# Patient Record
Sex: Female | Born: 2000 | Race: Black or African American | Hispanic: No | Marital: Single | Smoking: Never smoker
Health system: Southern US, Community
[De-identification: ages and names within clinical notes are randomized; demographics above are authoritative.]

## PROBLEM LIST (undated history)

## (undated) DIAGNOSIS — J45909 Unspecified asthma, uncomplicated: Secondary | ICD-10-CM

## (undated) DIAGNOSIS — R519 Headache, unspecified: Secondary | ICD-10-CM

## (undated) DIAGNOSIS — R51 Headache: Secondary | ICD-10-CM

## (undated) DIAGNOSIS — A549 Gonococcal infection, unspecified: Secondary | ICD-10-CM

## (undated) DIAGNOSIS — F419 Anxiety disorder, unspecified: Secondary | ICD-10-CM

## (undated) DIAGNOSIS — D649 Anemia, unspecified: Secondary | ICD-10-CM

## (undated) DIAGNOSIS — D573 Sickle-cell trait: Secondary | ICD-10-CM

## (undated) HISTORY — DX: Unspecified asthma, uncomplicated: J45.909

## (undated) HISTORY — DX: Sickle-cell trait: D57.3

## (undated) HISTORY — PX: WRIST SURGERY: SHX841

## (undated) HISTORY — DX: Headache, unspecified: R51.9

## (undated) HISTORY — DX: Anemia, unspecified: D64.9

## (undated) HISTORY — DX: Headache: R51

## (undated) HISTORY — DX: Gonococcal infection, unspecified: A54.9

## (undated) HISTORY — DX: Anxiety disorder, unspecified: F41.9

---
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
## 2018-01-17 ENCOUNTER — Ambulatory Visit: Payer: Medicaid Other | Admitting: Obstetrics and Gynecology

## 2018-01-29 ENCOUNTER — Ambulatory Visit: Payer: Medicaid Other | Admitting: Obstetrics and Gynecology

## 2019-01-10 IMAGING — CR DG ELBOW COMPLETE 3+V*R*
1 series · 4 of 4 positions shown · non-contrast
Comparison: None.

CLINICAL DATA: 16-year-old who injured the right elbow after
jumping off of a bed earlier today. Initial encounter.

EXAM:
RIGHT ELBOW - COMPLETE 3+ VIEW

[Series 1: x elbow ap right · 0.14mm/px · 4 of 4 slices shown]
[im 1/4]
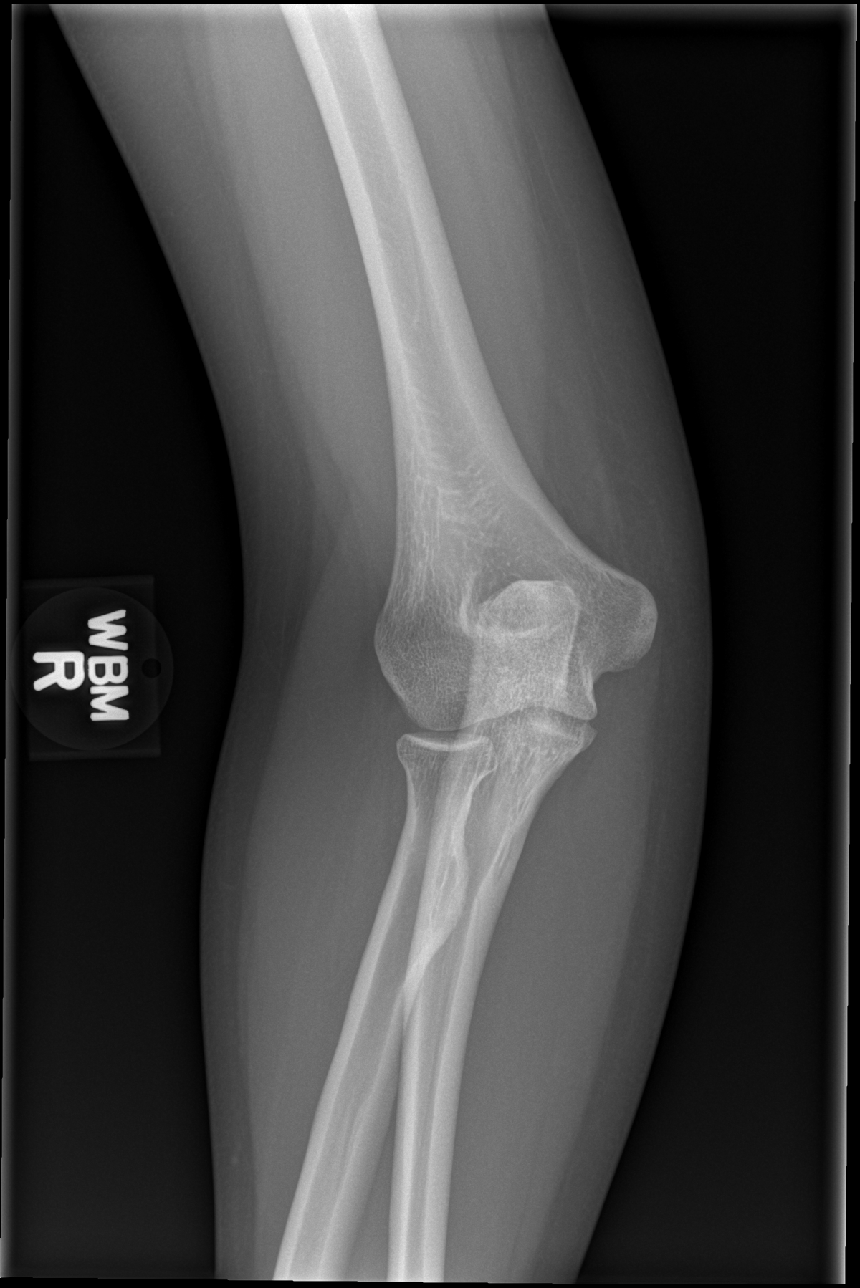
[im 2/4]
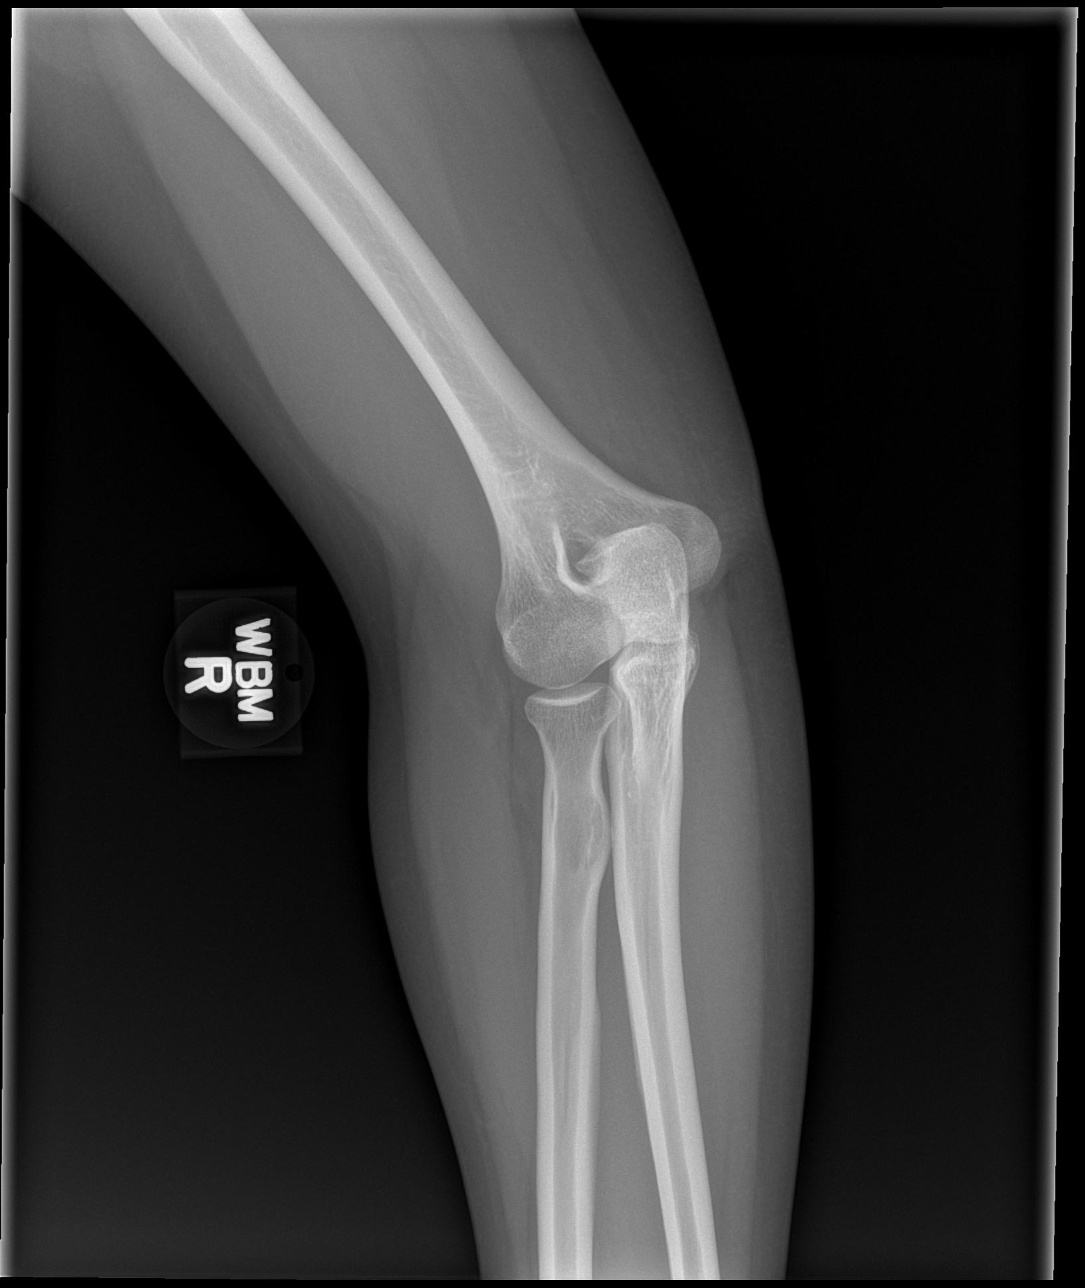
[im 3/4]
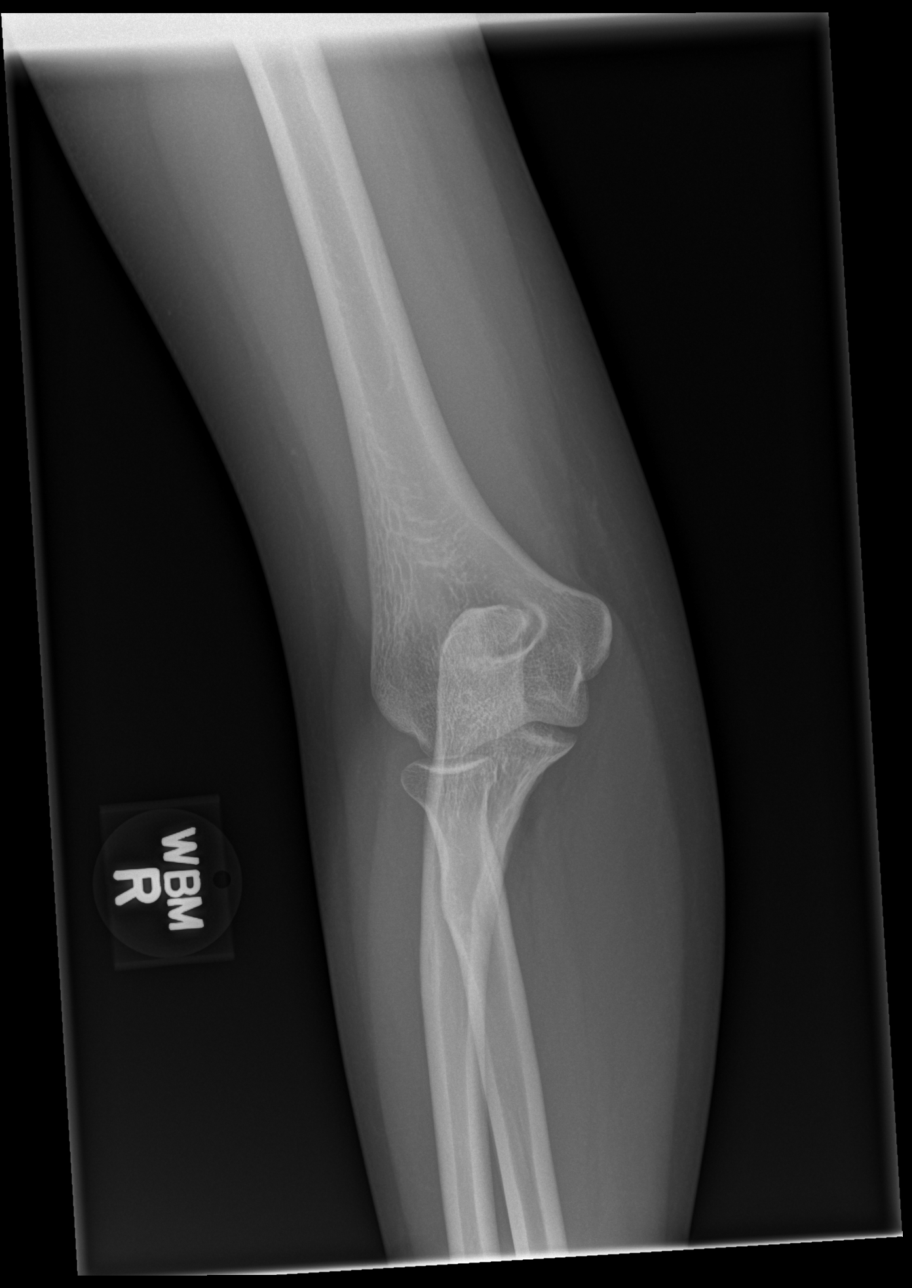
[im 4/4]
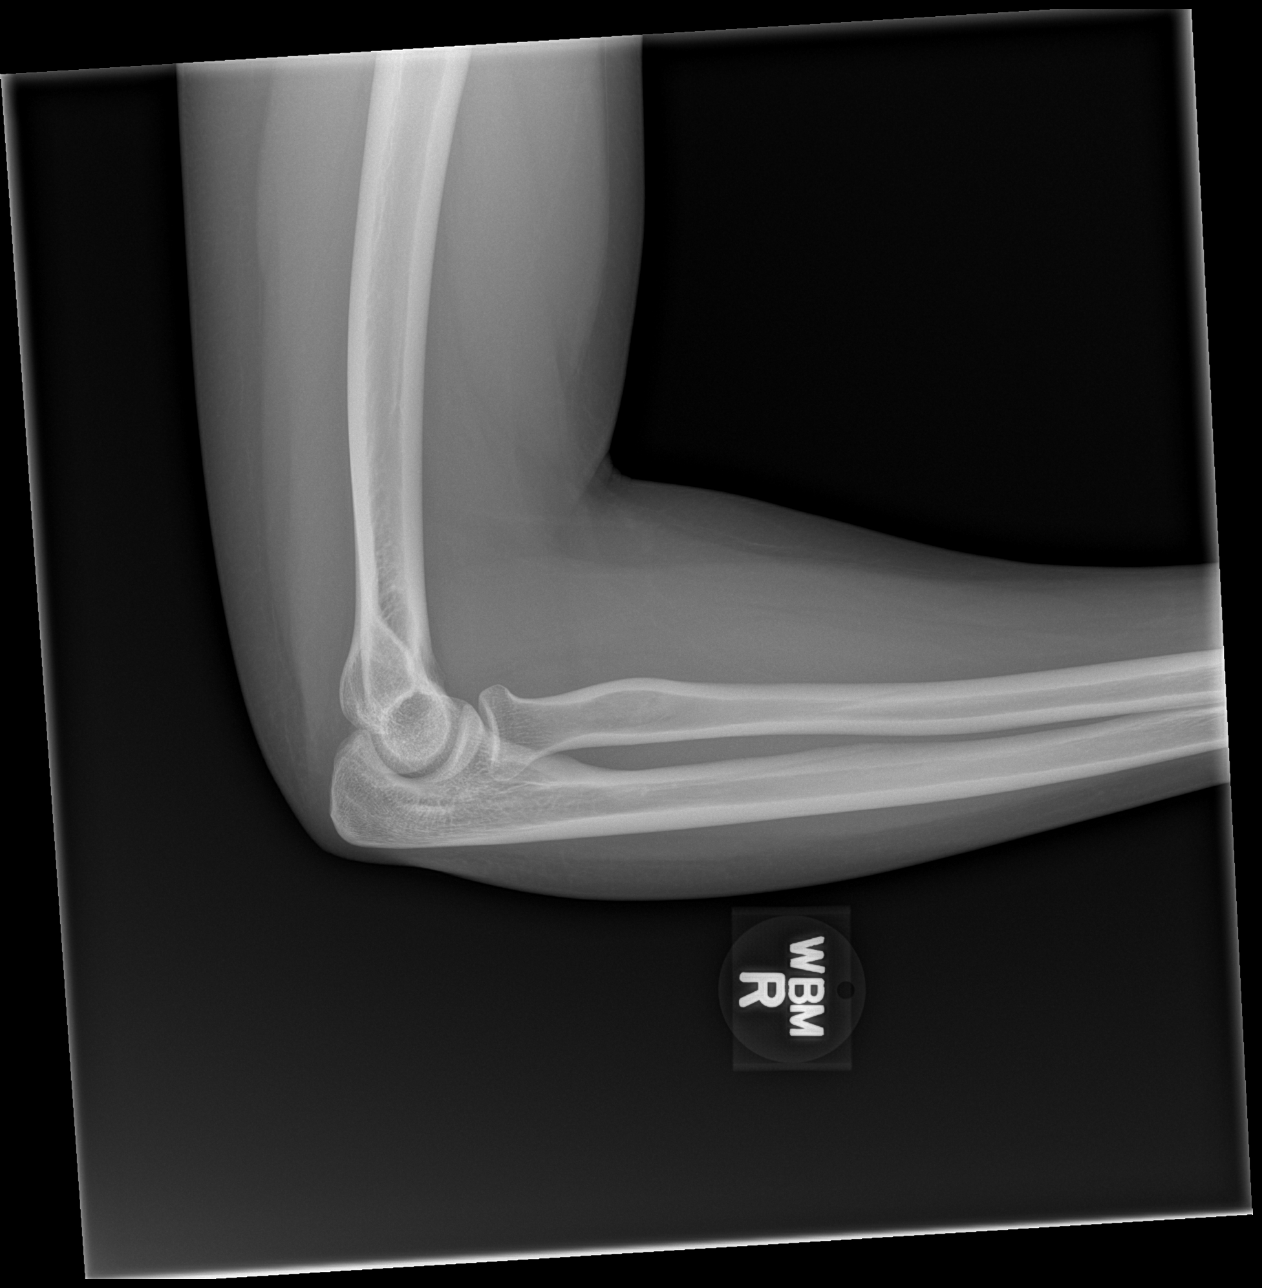

[4 of 4 positions shown; findings below may reference images not displayed]

FINDINGS: No evidence of acute fracture or dislocation. Well-preserved joint
spaces. No intrinsic osseous abnormalities. No posterior fat pad to
confirm joint effusion or hemarthrosis.
IMPRESSION: Normal examination.

## 2020-08-16 ENCOUNTER — Other Ambulatory Visit: Payer: Self-pay

## 2020-08-16 ENCOUNTER — Ambulatory Visit (INDEPENDENT_AMBULATORY_CARE_PROVIDER_SITE_OTHER): Payer: Medicaid Other | Admitting: Obstetrics and Gynecology

## 2020-08-16 ENCOUNTER — Encounter: Payer: Self-pay | Admitting: Obstetrics and Gynecology

## 2020-08-16 ENCOUNTER — Other Ambulatory Visit (HOSPITAL_COMMUNITY)
Admission: RE | Admit: 2020-08-16 | Discharge: 2020-08-16 | Disposition: A | Discharge status: Home / Self Care | Payer: Medicaid Other | Source: Ambulatory Visit | Attending: Obstetrics and Gynecology | Admitting: Obstetrics and Gynecology

## 2020-08-16 VITALS — BP 100/70 | Ht 60.0 in | Wt 140.0 lb

## 2020-08-16 DIAGNOSIS — T8332XA Displacement of intrauterine contraceptive device, initial encounter: Secondary | ICD-10-CM

## 2020-08-16 DIAGNOSIS — Z113 Encounter for screening for infections with a predominantly sexual mode of transmission: Secondary | ICD-10-CM | POA: Diagnosis present

## 2020-08-16 DIAGNOSIS — Z30432 Encounter for removal of intrauterine contraceptive device: Secondary | ICD-10-CM

## 2020-08-16 LAB — POCT URINE PREGNANCY: Preg Test, Ur: NEGATIVE

## 2020-08-16 NOTE — Patient Instructions (Signed)
I value your feedback and entrusting us with your care. If you get a  patient survey, I would appreciate you taking the time to let us know about your experience today. Thank you!  As of September 11, 2019, your lab results will be released to your MyChart immediately, before I even have a chance to see them. Please give me time to review them and contact you if there are any abnormalities. Thank you for your patience.  

## 2020-08-16 NOTE — Progress Notes (Signed)
Chief Complaint  Patient presents with   IUD removal    pain during intercourse, will be getting depo at her pcp in [place]     History of Present Illness:  Annette Perkins is a 19 y.o. that had a Mirena IUD placed approximately 2 1/2 yrs ago. Since that time, she has had dyspareunia and pelvic pain. States she was 6 months pregnant last summer but notes in Epic showed she had neg UPT at ED 4/20 and 8/20. Bleeding now is random and lasts 2-3 days, with dysmen. Pt had GYN u/s 1/21 that showed malpositioned IUD. Was told to have it removed and use condoms in meantime. Is here today for removal. Is sex active, not using condoms. Has appt for depo in [facility] with PCP in 2 days.   Has had neg pap per pt report, neg STD testing a few months ago but wants again today. Hx of gonorrhea in the past  Past Medical History:  Diagnosis Date   Anemia    Anxiety    Asthma    Frequent headaches    Gonorrhea    Sickle cell trait (HCC)    Past Surgical History:  Procedure Laterality Date   CESAREAN SECTION     WRIST SURGERY Bilateral    Family History  Problem Relation Age of Onset   Depression Mother    Mental illness Mother    Bipolar disorder Mother    Cancer Maternal Grandmother    Cancer Maternal Grandfather    Cancer Paternal Grandmother    Cancer Paternal Grandfather    Review of Systems  Constitutional: Negative for fever.  Gastrointestinal: Negative for blood in stool, constipation, diarrhea, nausea and vomiting.  Genitourinary: Positive for dyspareunia, pelvic pain and vaginal bleeding. Negative for dysuria, flank pain, frequency, hematuria, urgency, vaginal discharge and vaginal pain.  Musculoskeletal: Negative for back pain.  Skin: Negative for rash.    BP 100/70    Ht 5' (1.524 m)    Wt 140 lb (63.5 kg)    BMI 27.34 kg/m   Physical Exam Constitutional:      General: She is not in acute distress. Genitourinary:     Vulva, vagina, cervix, uterus, right adnexa  and left adnexa normal.     No vulval lesion, tenderness or ulcerations noted.     No vaginal discharge, erythema, tenderness or bleeding.     IUD strings visualized.     Uterus is not enlarged or tender.     No right or left adnexal mass present.     Right adnexa not tender.     Left adnexa not tender.     Genitourinary Comments: TIP OF IUD AT CX OPENING  Pulmonary:     Effort: Pulmonary effort is normal.  Musculoskeletal:        General: Normal range of motion.  Neurological:     General: No focal deficit present.     Mental Status: She is alert.     Cranial Nerves: No cranial nerve deficit.  Skin:    General: Skin is warm and dry.  Psychiatric:        Mood and Affect: Mood normal.        Behavior: Behavior normal.        Thought Content: Thought content normal.        Judgment: Judgment normal.  Vitals and nursing note reviewed.     Results for orders placed or performed in visit on 08/16/20 (from the past 24 hour(s))  POCT urine pregnancy     Status: Normal   Collection Time: 08/16/20  3:34 PM  Result Value Ref Range   Preg Test, Ur Negative Negative    IUD Removal Strings of IUD identified and grasped. Tip of IUD visible at cx os.  IUD removed without problem with ring forceps.  Pt tolerated this well.  IUD noted to be intact.  Assessment:  Malpositioned intrauterine device (IUD), initial encounter - Plan: POCT urine pregnancy; IUD in lower uterine segment on u/s 1/21. Neg UPT today. IUD removed. Pt to f/u for further eval prn pain/dyspareunia. Plans to get depo with PCP in 2 days. Abstinence in meantime, condoms for 7 days after depo. F/u pn.   Screening for STD (sexually transmitted disease) - Plan: Cervicovaginal ancillary only  Encounter for IUD removal   Plan: IUD removed and plan for contraception is Depo-Provera. She was amenable to this plan.  Return if symptoms worsen or fail to improve.   Melessia Kaus B. Kastin Cerda, PA-C 08/16/2020 3:53 PM

## 2020-08-18 LAB — CERVICOVAGINAL ANCILLARY ONLY
Chlamydia: NEGATIVE
Comment: NEGATIVE
Comment: NORMAL
Neisseria Gonorrhea: NEGATIVE
